# Patient Record
Sex: Female | Born: 1955 | Hispanic: No | Marital: Married | Smoking: Never smoker
Health system: Southern US, Community
[De-identification: ages and names within clinical notes are randomized; demographics above are authoritative.]

## PROBLEM LIST (undated history)

## (undated) DIAGNOSIS — Z8489 Family history of other specified conditions: Secondary | ICD-10-CM

## (undated) DIAGNOSIS — G4700 Insomnia, unspecified: Secondary | ICD-10-CM

---

## 2000-08-05 ENCOUNTER — Encounter: Payer: Self-pay | Admitting: Obstetrics and Gynecology

## 2000-08-05 ENCOUNTER — Encounter: Admission: RE | Admit: 2000-08-05 | Discharge: 2000-08-05 | Payer: Self-pay | Admitting: Obstetrics and Gynecology

## 2002-01-24 ENCOUNTER — Encounter: Payer: Self-pay | Admitting: Obstetrics and Gynecology

## 2002-01-24 ENCOUNTER — Encounter: Admission: RE | Admit: 2002-01-24 | Discharge: 2002-01-24 | Payer: Self-pay | Admitting: Obstetrics and Gynecology

## 2009-11-30 DIAGNOSIS — C50919 Malignant neoplasm of unspecified site of unspecified female breast: Secondary | ICD-10-CM

## 2009-11-30 HISTORY — PX: MASTECTOMY: SHX3

## 2009-11-30 HISTORY — DX: Malignant neoplasm of unspecified site of unspecified female breast: C50.919

## 2010-06-19 ENCOUNTER — Encounter: Admission: RE | Admit: 2010-06-19 | Discharge: 2010-06-19 | Payer: Self-pay | Admitting: Obstetrics and Gynecology

## 2010-06-23 ENCOUNTER — Encounter: Admission: RE | Admit: 2010-06-23 | Discharge: 2010-06-23 | Payer: Self-pay | Admitting: Obstetrics and Gynecology

## 2010-12-21 ENCOUNTER — Encounter: Payer: Self-pay | Admitting: Obstetrics and Gynecology

## 2016-11-06 HISTORY — PX: DE QUERVAIN'S RELEASE: SHX1439

## 2019-08-29 ENCOUNTER — Other Ambulatory Visit (HOSPITAL_COMMUNITY): Payer: Self-pay | Admitting: Podiatry

## 2019-08-29 ENCOUNTER — Other Ambulatory Visit: Payer: Self-pay | Admitting: Podiatry

## 2019-08-29 DIAGNOSIS — M79672 Pain in left foot: Secondary | ICD-10-CM

## 2019-08-29 DIAGNOSIS — M71372 Other bursal cyst, left ankle and foot: Secondary | ICD-10-CM

## 2019-09-07 ENCOUNTER — Ambulatory Visit: Payer: Medicare HMO

## 2019-09-07 ENCOUNTER — Ambulatory Visit: Admission: RE | Admit: 2019-09-07 | Payer: Medicare HMO | Source: Ambulatory Visit

## 2019-09-18 ENCOUNTER — Other Ambulatory Visit: Payer: Self-pay

## 2019-09-18 ENCOUNTER — Ambulatory Visit
Admission: RE | Admit: 2019-09-18 | Discharge: 2019-09-18 | Disposition: A | Discharge status: Home / Self Care | Payer: Medicare HMO | Source: Ambulatory Visit | Attending: Podiatry | Admitting: Podiatry

## 2019-09-18 DIAGNOSIS — M79672 Pain in left foot: Secondary | ICD-10-CM | POA: Diagnosis present

## 2019-09-18 DIAGNOSIS — M71372 Other bursal cyst, left ankle and foot: Secondary | ICD-10-CM | POA: Diagnosis not present

## 2019-09-18 MED ORDER — GADOBUTROL 1 MMOL/ML IV SOLN
6.0000 mL | Freq: Once | INTRAVENOUS | Status: AC | PRN
  Administered 2019-09-18: 19:00:00 6 mL via INTRAVENOUS

## 2019-09-27 ENCOUNTER — Other Ambulatory Visit: Payer: Self-pay | Admitting: Podiatry

## 2019-09-27 NOTE — Addendum Note (Signed)
Addended by: Caroline More on: 09/27/2019 01:16 PM   Modules accepted: Orders

## 2019-10-03 ENCOUNTER — Encounter: Payer: Self-pay | Admitting: *Deleted

## 2019-10-03 ENCOUNTER — Other Ambulatory Visit: Payer: Self-pay

## 2019-10-06 ENCOUNTER — Other Ambulatory Visit: Payer: Self-pay

## 2019-10-06 ENCOUNTER — Other Ambulatory Visit
Admission: RE | Admit: 2019-10-06 | Discharge: 2019-10-06 | Disposition: A | Discharge status: Home / Self Care | Payer: Medicare HMO | Source: Ambulatory Visit | Attending: Podiatry | Admitting: Podiatry

## 2019-10-06 DIAGNOSIS — Z01812 Encounter for preprocedural laboratory examination: Secondary | ICD-10-CM | POA: Diagnosis present

## 2019-10-06 DIAGNOSIS — Z20828 Contact with and (suspected) exposure to other viral communicable diseases: Secondary | ICD-10-CM | POA: Insufficient documentation

## 2019-10-06 LAB — SARS CORONAVIRUS 2 (TAT 6-24 HRS): SARS Coronavirus 2: NEGATIVE

## 2019-10-10 ENCOUNTER — Ambulatory Visit: Payer: Medicare HMO | Admitting: Anesthesiology

## 2019-10-10 ENCOUNTER — Other Ambulatory Visit: Payer: Self-pay

## 2019-10-10 ENCOUNTER — Ambulatory Visit
Admission: RE | Admit: 2019-10-10 | Discharge: 2019-10-10 | Disposition: A | Discharge status: Home / Self Care | Payer: Medicare HMO | Attending: Podiatry | Admitting: Podiatry

## 2019-10-10 ENCOUNTER — Encounter
Admission: RE | Disposition: A | Discharge status: Home / Self Care | Payer: Self-pay | Source: Home / Self Care | Attending: Podiatry

## 2019-10-10 DIAGNOSIS — F419 Anxiety disorder, unspecified: Secondary | ICD-10-CM | POA: Insufficient documentation

## 2019-10-10 DIAGNOSIS — M722 Plantar fascial fibromatosis: Secondary | ICD-10-CM | POA: Diagnosis present

## 2019-10-10 DIAGNOSIS — G4700 Insomnia, unspecified: Secondary | ICD-10-CM | POA: Diagnosis not present

## 2019-10-10 DIAGNOSIS — Z853 Personal history of malignant neoplasm of breast: Secondary | ICD-10-CM | POA: Diagnosis not present

## 2019-10-10 DIAGNOSIS — Z79899 Other long term (current) drug therapy: Secondary | ICD-10-CM | POA: Diagnosis not present

## 2019-10-10 HISTORY — PX: FASCIECTOMY: SHX6525

## 2019-10-10 HISTORY — DX: Family history of other specified conditions: Z84.89

## 2019-10-10 HISTORY — DX: Insomnia, unspecified: G47.00

## 2019-10-10 HISTORY — PX: FOREIGN BODY REMOVAL: SHX962

## 2019-10-10 SURGERY — FASCIECTOMY, PALM
Anesthesia: Regional | Site: Foot | Laterality: Left

## 2019-10-10 MED ORDER — GLYCOPYRROLATE 0.2 MG/ML IJ SOLN
INTRAMUSCULAR | Status: DC | PRN
  Administered 2019-10-10: 0.1 mg via INTRAVENOUS

## 2019-10-10 MED ORDER — PROPOFOL 10 MG/ML IV BOLUS
INTRAVENOUS | Status: DC | PRN
  Administered 2019-10-10: 30 mg via INTRAVENOUS
  Administered 2019-10-10: 150 mg via INTRAVENOUS

## 2019-10-10 MED ORDER — CEPHALEXIN 500 MG PO CAPS: 500.0000 mg | ORAL_CAPSULE | Freq: Three times a day (TID) | ORAL | 0 refills | Status: AC

## 2019-10-10 MED ORDER — CEFAZOLIN SODIUM-DEXTROSE 2-4 GM/100ML-% IV SOLN
2.0000 g | INTRAVENOUS | Status: AC
  Administered 2019-10-10: 2 g via INTRAVENOUS

## 2019-10-10 MED ORDER — POVIDONE-IODINE 7.5 % EX SOLN
Freq: Once | CUTANEOUS | Status: AC
  Administered 2019-10-10: 12:00:00 via TOPICAL

## 2019-10-10 MED ORDER — BUPIVACAINE HCL 0.25 % IJ SOLN
INTRAMUSCULAR | Status: DC | PRN
  Administered 2019-10-10: 20 mL

## 2019-10-10 MED ORDER — OXYCODONE-ACETAMINOPHEN 7.5-325 MG PO TABS: 1.0000 | ORAL_TABLET | Freq: Four times a day (QID) | ORAL | 0 refills | Status: AC | PRN

## 2019-10-10 MED ORDER — OXYCODONE HCL 5 MG/5ML PO SOLN: 5.0000 mg | Freq: Once | ORAL | Status: DC | PRN

## 2019-10-10 MED ORDER — ONDANSETRON HCL 4 MG/2ML IJ SOLN
INTRAMUSCULAR | Status: DC | PRN
  Administered 2019-10-10: 4 mg via INTRAVENOUS

## 2019-10-10 MED ORDER — OXYCODONE HCL 5 MG PO TABS: 5.0000 mg | ORAL_TABLET | Freq: Once | ORAL | Status: DC | PRN

## 2019-10-10 MED ORDER — LIDOCAINE HCL (CARDIAC) PF 100 MG/5ML IV SOSY
PREFILLED_SYRINGE | INTRAVENOUS | Status: DC | PRN
  Administered 2019-10-10: 40 mg via INTRATRACHEAL

## 2019-10-10 MED ORDER — ONDANSETRON 4 MG PO TBDP
4.0000 mg | ORAL_TABLET | Freq: Once | ORAL | Status: AC
  Administered 2019-10-10: 4 mg via ORAL

## 2019-10-10 MED ORDER — EPHEDRINE SULFATE 50 MG/ML IJ SOLN
INTRAMUSCULAR | Status: DC | PRN
  Administered 2019-10-10: 10 mg via INTRAVENOUS

## 2019-10-10 MED ORDER — LACTATED RINGERS IV SOLN
INTRAVENOUS | Status: DC
  Administered 2019-10-10: 11:00:00 via INTRAVENOUS

## 2019-10-10 MED ORDER — MIDAZOLAM HCL 2 MG/2ML IJ SOLN
INTRAMUSCULAR | Status: DC | PRN
  Administered 2019-10-10: 2 mg via INTRAVENOUS

## 2019-10-10 MED ORDER — DEXAMETHASONE SODIUM PHOSPHATE 4 MG/ML IJ SOLN
INTRAMUSCULAR | Status: DC | PRN
  Administered 2019-10-10: 4 mg via PERINEURAL

## 2019-10-10 MED ORDER — ONDANSETRON HCL 4 MG PO TABS: 4.0000 mg | ORAL_TABLET | Freq: Three times a day (TID) | ORAL | 0 refills | Status: AC | PRN

## 2019-10-10 MED ORDER — FENTANYL CITRATE (PF) 100 MCG/2ML IJ SOLN
INTRAMUSCULAR | Status: DC | PRN
  Administered 2019-10-10: 25 ug via INTRAVENOUS
  Administered 2019-10-10: 100 ug via INTRAVENOUS

## 2019-10-10 MED ORDER — ROPIVACAINE HCL 5 MG/ML IJ SOLN
INTRAMUSCULAR | Status: DC | PRN
  Administered 2019-10-10 (×2): 20 mL via PERINEURAL

## 2019-10-10 SURGICAL SUPPLY — 33 items
BNDG CMPR STD VLCR NS LF 5.8X4 (GAUZE/BANDAGES/DRESSINGS) ×1
BNDG ELASTIC 4X5.8 VLCR NS LF (GAUZE/BANDAGES/DRESSINGS) ×2 IMPLANT
BNDG ESMARK 4X12 TAN STRL LF (GAUZE/BANDAGES/DRESSINGS) ×2 IMPLANT
BNDG GAUZE 4.5X4.1 6PLY STRL (MISCELLANEOUS) ×2 IMPLANT
CANISTER SUCT 1200ML W/VALVE (MISCELLANEOUS) ×2 IMPLANT
COVER LIGHT HANDLE FLEXIBLE (MISCELLANEOUS) ×4 IMPLANT
CUFF TOURN SGL QUICK 18X4 (TOURNIQUET CUFF) ×2 IMPLANT
DRAPE FLUOR MINI C-ARM 54X84 (DRAPES) ×2 IMPLANT
DURAPREP 26ML APPLICATOR (WOUND CARE) ×2 IMPLANT
ELECT REM PT RETURN 9FT ADLT (ELECTROSURGICAL) ×2
ELECTRODE REM PT RTRN 9FT ADLT (ELECTROSURGICAL) ×1 IMPLANT
GAUZE SPONGE 4X4 12PLY STRL (GAUZE/BANDAGES/DRESSINGS) ×2 IMPLANT
GAUZE XEROFORM 5X9 LF (GAUZE/BANDAGES/DRESSINGS) ×1 IMPLANT
GLOVE BIO SURGEON STRL SZ7 (GLOVE) ×2 IMPLANT
GLOVE BIOGEL PI IND STRL 7.0 (GLOVE) ×1 IMPLANT
GLOVE BIOGEL PI INDICATOR 7.0 (GLOVE) ×1
GOWN STRL REUS W/ TWL LRG LVL3 (GOWN DISPOSABLE) ×2 IMPLANT
GOWN STRL REUS W/TWL LRG LVL3 (GOWN DISPOSABLE) ×4
K-WIRE DBL END TROCAR 6X.062 (WIRE) ×2
KIT CARPAL TUNNEL (MISCELLANEOUS) ×2
KIT PRC PRB RTRGD 3ANG KNF HND (MISCELLANEOUS) IMPLANT
KIT TURNOVER KIT A (KITS) ×2 IMPLANT
KWIRE DBL END TROCAR 6X.062 (WIRE) IMPLANT
NS IRRIG 500ML POUR BTL (IV SOLUTION) ×2 IMPLANT
PACK EXTREMITY ARMC (MISCELLANEOUS) ×2 IMPLANT
PENCIL SMOKE EVACUATOR (MISCELLANEOUS) ×2 IMPLANT
STOCKINETTE IMPERVIOUS LG (DRAPES) ×2 IMPLANT
SUT ETHILON 4-0 (SUTURE) ×4
SUT ETHILON 4-0 FS2 18XMFL BLK (SUTURE) ×2
SUT VIC AB 3-0 SH 27 (SUTURE) ×2
SUT VIC AB 3-0 SH 27X BRD (SUTURE) IMPLANT
SUTURE ETHLN 4-0 FS2 18XMF BLK (SUTURE) IMPLANT
WAND TOPAZ MICRO DEBRIDER (MISCELLANEOUS) ×1 IMPLANT

## 2019-10-10 NOTE — Anesthesia Procedure Notes (Addendum)
Anesthesia Regional Block: Adductor canal block   Pre-Anesthetic Checklist: ,, timeout performed, Correct Patient, Correct Site, Correct Laterality, Correct Procedure, Correct Position, site marked, Risks and benefits discussed,  Surgical consent,  Pre-op evaluation,  At surgeon's request and post-op pain management  Laterality: Left  Prep: chloraprep       Needles:  Injection technique: Single-shot  Needle Type: Echogenic Needle     Needle Length: 9cm  Needle Gauge: 21     Additional Needles:   Procedures:,,,, ultrasound used (permanent image in chart),,,,   Nerve Stimulator or Paresthesia:   Additional Responses:  Supine position Narrative:  Start time: 10/10/2019 9:23 AM End time: 10/10/2019 9:28 AM Injection made incrementally with aspirations every 5 mL.  Performed by: Personally  Anesthesiologist: Fidel Levy, MD  Additional Notes: Functioning IV was confirmed and monitors applied. Ultrasound guidance: relevant anatomy identified, needle position confirmed, local anesthetic spread visualized around nerve(s)., vascular puncture avoided.  Image printed for medical record.  Negative aspiration and no paresthesias; incremental administration of local anesthetic. The patient tolerated the procedure well. Vitals signes recorded in RN notes.

## 2019-10-10 NOTE — Anesthesia Preprocedure Evaluation (Signed)
Anesthesia Evaluation  Patient identified by MRN, date of birth, ID band Patient awake    Reviewed: NPO status   History of Anesthesia Complications Negative for: history of anesthetic complications  Airway Mallampati: II  TM Distance: >3 FB Neck ROM: full    Dental no notable dental hx.    Pulmonary neg pulmonary ROS,    Pulmonary exam normal        Cardiovascular Exercise Tolerance: Good negative cardio ROS Normal cardiovascular exam     Neuro/Psych Anxiety RLegS negative neurological ROS  negative psych ROS   GI/Hepatic negative GI ROS, Neg liver ROS,   Endo/Other  negative endocrine ROS  Renal/GU negative Renal ROS  negative genitourinary   Musculoskeletal   Abdominal   Peds  Hematology R breast cancer 2012 > R mastectomy. NO IV, NO BP on R arm.   Anesthesia Other Findings Covid: NEG.  Reproductive/Obstetrics                             Anesthesia Physical Anesthesia Plan  ASA: II  Anesthesia Plan: Regional and General   Post-op Pain Management: GA combined w/ Regional for post-op pain   Induction:   PONV Risk Score and Plan: 2 and Midazolam and Ondansetron  Airway Management Planned:   Additional Equipment:   Intra-op Plan:   Post-operative Plan:   Informed Consent: I have reviewed the patients History and Physical, chart, labs and discussed the procedure including the risks, benefits and alternatives for the proposed anesthesia with the patient or authorized representative who has indicated his/her understanding and acceptance.       Plan Discussed with: CRNA  Anesthesia Plan Comments:         Anesthesia Quick Evaluation

## 2019-10-10 NOTE — Anesthesia Procedure Notes (Signed)
Procedure Name: LMA Insertion Date/Time: 10/10/2019 12:29 PM Performed by: Mayme Genta, CRNA Pre-anesthesia Checklist: Patient identified, Emergency Drugs available, Suction available, Timeout performed and Patient being monitored Patient Re-evaluated:Patient Re-evaluated prior to induction Oxygen Delivery Method: Circle system utilized Preoxygenation: Pre-oxygenation with 100% oxygen Induction Type: IV induction LMA: LMA inserted LMA Size: 4.0 Number of attempts: 1 Placement Confirmation: positive ETCO2 and breath sounds checked- equal and bilateral Tube secured with: Tape

## 2019-10-10 NOTE — Progress Notes (Signed)
Assisted Debabtrata Maji ANMD with left, ultrasound guided, popliteal/saphenous block. Side rails up, monitors on throughout procedure. See vital signs in flow sheet. Tolerated Procedure well.

## 2019-10-10 NOTE — Transfer of Care (Signed)
Immediate Anesthesia Transfer of Care Note  Patient: Angela Weber  Procedure(s) Performed: PLANTAR FASCIECTOMY- INCIS.ONLY LEFT (Left Foot) REMOVAL FOREIGN BODY DEEP (Left Foot)  Patient Location: PACU  Anesthesia Type: Regional, General  Level of Consciousness: awake, alert  and patient cooperative  Airway and Oxygen Therapy: Patient Spontanous Breathing and Patient connected to supplemental oxygen  Post-op Assessment: Post-op Vital signs reviewed, Patient's Cardiovascular Status Stable, Respiratory Function Stable, Patent Airway and No signs of Nausea or vomiting  Post-op Vital Signs: Reviewed and stable  Complications: No apparent anesthesia complications

## 2019-10-10 NOTE — Discharge Instructions (Signed)
[place] DR. TROXLER, DR. Vickki Muff, AND DR. [place]   1. Take your medication as prescribed.  Pain medication should be taken only as needed.  Begin by taking her Percocet medication once every 6 hours.  If you continued to have pain you may take the Percocet medication once every 4 hours.  If you continue to have pain at that point then try taking Tylenol and/or ibuprofen between doses.  You can have a maximum dosage of acetaminophen or Tylenol of 4000 mg a day.  The Percocet has 325 mg of Tylenol in each pill.  If you still even further continue to have pain then you can take 2 pills every 6 hours of Percocet.  Please take antibiotics and antinausea medicine as prescribed.  2. Keep the dressing clean, dry and intact.  3. Keep your foot elevated above the heart level for the first 48 hours.  4. Walking to the bathroom and brief periods of walking are acceptable, unless we have instructed you to be non-weight bearing.  You are required to be nonweightbearing for the next 4 weeks on your left lower extremity.  Please do not apply any pressure to the heel.  5. Always wear your post-op shoe when walking.  Always use your crutches if you are to be non-weight bearing.  6. Do not take a shower. Baths are permissible as long as the foot is kept out of the water.   7. Every hour you are awake:  - Bend your knee 15 times. - Flex foot 15 times - Massage calf 15 times  8. Call [facility] [phone]) if any of the following problems occur: - You develop a temperature or fever. - The bandage becomes saturated with blood. - Medication does not stop your pain. - Injury of the foot occurs. - Any symptoms of infection including redness, odor, or red streaks running from wound.   General Anesthesia, Adult, Care After This sheet gives you information about how to care for yourself  after your procedure. Your health care provider may also give you more specific instructions. If you have problems or questions, contact your health care provider. What can I expect after the procedure? After the procedure, the following side effects are common:  Pain or discomfort at the IV site.  Nausea.  Vomiting.  Sore throat.  Trouble concentrating.  Feeling cold or chills.  Weak or tired.  Sleepiness and fatigue.  Soreness and body aches. These side effects can affect parts of the body that were not involved in surgery. Follow these instructions at home:  For at least 24 hours after the procedure:  Have a responsible adult stay with you. It is important to have someone help care for you until you are awake and alert.  Rest as needed.  Do not: ? Participate in activities in which you could fall or become injured. ? Drive. ? Use heavy machinery. ? Drink alcohol. ? Take sleeping pills or medicines that cause drowsiness. ? Make important decisions or sign legal documents. ? Take care of children on your own. Eating and drinking  Follow any instructions from your health care provider about eating or drinking restrictions.  When you feel hungry, start by eating small amounts of foods that are soft and easy to digest (bland), such as toast. Gradually return to your regular diet.  Drink enough fluid to keep your urine pale yellow.  If you vomit, rehydrate  by drinking water, juice, or clear broth. General instructions  If you have sleep apnea, surgery and certain medicines can increase your risk for breathing problems. Follow instructions from your health care provider about wearing your sleep device: ? Anytime you are sleeping, including during daytime naps. ? While taking prescription pain medicines, sleeping medicines, or medicines that make you drowsy.  Return to your normal activities as told by your health care provider. Ask your health care provider what  activities are safe for you.  Take over-the-counter and prescription medicines only as told by your health care provider.  If you smoke, do not smoke without supervision.  Keep all follow-up visits as told by your health care provider. This is important. Contact a health care provider if:  You have nausea or vomiting that does not get better with medicine.  You cannot eat or drink without vomiting.  You have pain that does not get better with medicine.  You are unable to pass urine.  You develop a skin rash.  You have a fever.  You have redness around your IV site that gets worse. Get help right away if:  You have difficulty breathing.  You have chest pain.  You have blood in your urine or stool, or you vomit blood. Summary  After the procedure, it is common to have a sore throat or nausea. It is also common to feel tired.  Have a responsible adult stay with you for the first 24 hours after general anesthesia. It is important to have someone help care for you until you are awake and alert.  When you feel hungry, start by eating small amounts of foods that are soft and easy to digest (bland), such as toast. Gradually return to your regular diet.  Drink enough fluid to keep your urine pale yellow.  Return to your normal activities as told by your health care provider. Ask your health care provider what activities are safe for you. This information is not intended to replace advice given to you by your health care provider. Make sure you discuss any questions you have with your health care provider. Document Released: 02/22/2001 Document Revised: 11/19/2017 Document Reviewed: 07/02/2017 Elsevier Patient Education  2020 [employer].

## 2019-10-10 NOTE — Anesthesia Procedure Notes (Addendum)
Anesthesia Regional Block: Popliteal block   Pre-Anesthetic Checklist: ,, timeout performed, Correct Patient, Correct Site, Correct Laterality, Correct Procedure, Correct Position, site marked, Risks and benefits discussed,  Surgical consent,  Pre-op evaluation,  At surgeon's request and post-op pain management  Laterality: Left  Prep: chloraprep       Needles:  Injection technique: Single-shot  Needle Type: Echogenic Needle     Needle Length: 9cm  Needle Gauge: 21     Additional Needles:   Procedures:,,,, ultrasound used (permanent image in chart),,,,   Nerve Stimulator or Paresthesia:   Additional Responses:  R lateral psotion Narrative:  Start time: 10/10/2019 9:18 AM End time: 10/10/2019 9:23 AM Injection made incrementally with aspirations every 5 mL.  Performed by: Personally  Anesthesiologist: Fidel Levy, MD  Additional Notes: Functioning IV was confirmed and monitors applied. Ultrasound guidance: relevant anatomy identified, needle position confirmed, local anesthetic spread visualized around nerve(s)., vascular puncture avoided.  Image printed for medical record.  Negative aspiration and no paresthesias; incremental administration of local anesthetic. The patient tolerated the procedure well. Vitals signes recorded in RN notes.

## 2019-10-10 NOTE — Anesthesia Postprocedure Evaluation (Signed)
Anesthesia Post Note  Patient: Angela Weber  Procedure(s) Performed: PLANTAR FASCIECTOMY- INCIS.ONLY LEFT (Left Foot) REMOVAL FOREIGN BODY DEEP (Left Foot)     Patient location during evaluation: PACU Anesthesia Type: Regional Level of consciousness: awake and alert Pain management: pain level controlled Vital Signs Assessment: post-procedure vital signs reviewed and stable Respiratory status: spontaneous breathing, nonlabored ventilation, respiratory function stable and patient connected to nasal cannula oxygen Cardiovascular status: blood pressure returned to baseline and stable Postop Assessment: no apparent nausea or vomiting Anesthetic complications: no    Kally Cadden

## 2019-10-10 NOTE — Op Note (Signed)
PODIATRY / FOOT AND ANKLE SURGERY OPERATIVE REPORT    SURGEON: Caroline More, DPM  PRE-OPERATIVE DIAGNOSIS:  1. Plantar fasciitis left foot, partial tear, chronic thickening 2. Possible foreign body/bursa like formation plantar left heel  POST-OPERATIVE DIAGNOSIS: same  PROCEDURE(S): 1. Left open plantar fasciiotomy with partial plantar fasciectomy and removal of scar tissue 2. Topaz plantar left heel  HEMOSTASIS: ankle left tourniquet  ANESTHESIA: general, preop popliteal and saphenous nerve block  ESTIMATED BLOOD LOSS: 10 cc  FINDING(S): 1.  Very thick medial and central bands of the plantar fascia 2.  Partial tear of the plantar fascia medial band  3.  Large amount of scar tissue to the plantar left heel in the area of the plantar fascia, no foreign body present  PATHOLOGY/SPECIMEN(S):  Thickened plantar fascia scar tissue  INDICATIONS:   Angela Weber is a 63 y.o. female who presents with plantar heel pain for months.  Patient was seen in clinic after saying that she stepped on a Christmas type ornament.  Patient delayed coming into clinic due to fears of COVID-19.  On presentation patient has pain on palpation of the medial tubercle the calcaneal tuberosity and has no signs of a puncture wound present.  Ultrasound imaging was used to examine the plantar aspect of the left heel which showed an irregularity in the soft tissues as well as a thickened band of the plantar fascia.  An MRI was subsequently performed at that point showing no specific foreign body present but strange bursal-like area at the plantar aspect the left heel and a thickened left plantar fascia with partial tear.  Patient also has mild edema at the level of the calcaneus at the insertion of the plantar fascia.  Discussed all treatment options with the patient both conservative and surgical attempts at correction at this time the patient is elected for surgery consisting of resection of the plantar fascia with  possible removal of foreign body/scar tissue/bursa resection.  DESCRIPTION: After obtaining full informed written consent, the patient was brought back to the operating room and placed supine upon the operating table.  The patient received IV antibiotics prior to induction.  Left ankle tourniquet was applied.  After obtaining adequate anesthesia, the patient was prepped and draped in the standard fashion.  An Esmarch bandage was used to exsanguinate the left lower extremity and pneumatic ankle tourniquet was inflated.  A curvilinear incision was made over the medial aspect of the heel with the distal aspect of the incision extending to the plantar arch level.  The incision was deepened to the subcutaneous tissues utilizing sharp and blunt dissection care was taken to identify and retract all vital neurovascular structures all venous contributories were cauterized necessary.  Blunt dissection was continued through the fat pad of the heel creating a full-thickness flap that was elevated being careful not to remove any of the fat from the heel in general.  The plantar fascia was then able to be visualized distally and it was followed back to the proximal insertion of the heel.  The subcutaneous tissue was dissected and explored a little further through the plantar aspect of the heel especially at the medial tubercle of the calcaneus and no bursa or foreign body was noted within the soft tissues of the subcutaneous area.  The medial band of the plantar fascia as well as one half of the central band of the plantar fascia was resected slightly distal to the insertion of the calcaneus and a triangular wedge was resected and  passed off in the operative site and sent off for pathology.  The plantar fascia appeared to be severely thickened overall was approximately 5 mm thick.  The flexor digitorum brevis muscle belly was able to be identified after transection of the plantar fascia was performed indicating successful  release.  The fascia appeared to be severely thickened at the insertion of the heel so portion of the plantar fascial from this area was also resected and passed off the operative site and sent off for pathology.  No foreign body was able to be visualized within the soft tissues of the plantar fascia or the subcutaneous tissue at the plantar heel.  The surgical site was flushed with copious amounts normal sterile saline.  The subcutaneous tissue was reapproximated well coapted with 3-0 Vicryl and the skin was then reapproximated well coapted with 4-0 nylon in a combination of simple and horizontal mattress type stitching.  Attention was then directed to the plantar aspect the left heel where a 0.062 K wire was used to create a grid at the plantar aspect of the heel that was 5 x 3.  The K wire was used to puncture the skin into the subcutaneous tissue.  The Topaz instrument was then used in each hole at the plantar aspect of the heel along the course the plantar fascia and was used 3 times in each hole x15 holes total.  20 cc of half percent Marcaine plain was injected about the operative site.  After the procedure Xeroform was applied to the surgical sites followed by 4 x 4 gauze, ABD, Kerlix, Ace wrap.  The pneumatic ankle tourniquet was deflated and a prompt hyperemic response was noted all digits left foot.  The patient tolerated the procedure and anesthesia well was transferred to recovery room fossae and stable vascular status intact to all toes left foot.  Following.  Postoperative monitoring the patient be discharged home the following written and oral postop instructions: Keep surgical dressings clean, dry, and intact, remain nonweightbearing to left lower extremity all times, ice and elevate left lower extremity when at rest, take postop pain medication, antibiotics, and antinausea medicine as prescribed.  Patient should follow-up in clinic within 1 week of discharge date.  COMPLICATIONS:  none  CONDITION: good, stable  Caroline More, DPM

## 2019-10-10 NOTE — H&P (Signed)
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  The various methods of treatment have been discussed with the patient.  No guarantees were given.  After consideration of risks, benefits and other options for treatment, the patient has consented to surgery.  I have reviewed the patients' chart and labs.    Procedure: left plantar fasciotomy with topaz debridement, removal of foreign body/bursal type tissue  A history and physical examination was performed in my office.  The patient was reexamined.  There have been no changes to this history and physical examination.  Caroline More, DPM

## 2019-10-11 ENCOUNTER — Encounter: Payer: Self-pay | Admitting: Podiatry

## 2019-10-12 LAB — SURGICAL PATHOLOGY

## 2020-01-05 ENCOUNTER — Other Ambulatory Visit: Payer: Self-pay

## 2020-01-05 ENCOUNTER — Ambulatory Visit
Admission: EM | Admit: 2020-01-05 | Discharge: 2020-01-05 | Disposition: A | Discharge status: Home / Self Care | Payer: Medicare HMO | Attending: Emergency Medicine | Admitting: Emergency Medicine

## 2020-01-05 DIAGNOSIS — Z20822 Contact with and (suspected) exposure to covid-19: Secondary | ICD-10-CM

## 2020-01-05 DIAGNOSIS — J0100 Acute maxillary sinusitis, unspecified: Secondary | ICD-10-CM

## 2020-01-05 MED ORDER — AMOXICILLIN-POT CLAVULANATE 875-125 MG PO TABS: 1.0000 | ORAL_TABLET | Freq: Two times a day (BID) | ORAL | 0 refills | Status: AC

## 2020-01-05 MED ORDER — BENZONATATE 100 MG PO CAPS: 100.0000 mg | ORAL_CAPSULE | Freq: Three times a day (TID) | ORAL | 0 refills | Status: DC

## 2020-01-05 MED ORDER — FLUTICASONE PROPIONATE 50 MCG/ACT NA SUSP: 2.0000 | Freq: Every day | NASAL | 0 refills | Status: AC

## 2020-01-05 MED ORDER — CETIRIZINE HCL 10 MG PO TABS: 10.0000 mg | ORAL_TABLET | Freq: Every day | ORAL | 0 refills | Status: AC

## 2020-01-05 NOTE — ED Triage Notes (Signed)
Pt presents to UC w/ c/o headaches, body aches, sore throat x4 days. Pt states this is worst headache she's ever had.

## 2020-01-05 NOTE — Discharge Instructions (Addendum)
Discussed worst headache of life with patient.  Declines further work-up in the ED at this time.  Aware of risk associated with this decision including missed diagnosis, organ damage, organ failure, and/or death.  Would like to try outpatient therapy first.    Based on symptoms will treat for sinusitis COVID testing ordered.  It will take between 2-5 days for test results.  Someone will contact you regarding abnormal results.    In the meantime: You should remain isolated in your home for 10 days from symptom onset AND greater than 72 hours after symptoms resolution (absence of fever without the use of fever-reducing medication and improvement in respiratory symptoms), whichever is longer Get plenty of rest and push fluids Tessalon Perles prescribed for cough Use OTC zyrtec for nasal congestion, runny nose, and/or sore throat Use OTC flonase for nasal congestion and runny nose Use medications daily for symptom relief Use OTC medications like ibuprofen or tylenol as needed fever or pain Call or go to the ED if you have any new or worsening symptoms such as fever, worsening cough, shortness of breath, chest tightness, chest pain, turning blue, changes in mental status, etc..Marland Kitchen

## 2020-01-05 NOTE — ED Provider Notes (Signed)
[place]   [id number] 01/05/20 Arrival Time: 1243   CC: COVID symptoms  SUBJECTIVE: History from: patient.  Angela Weber is a 64 y.o. female who presents with headache, runny nose, congestion, sore throat, mild productive cough, body aches, chills, and fever, tmax of 100.9 today, x 5 days.  Cousins daughter tested positive for COVID.  Cousin is awaiting covid results.  Has tried OTC medication with relief.  Symptoms were improving, but began worsening 2 days ago.  Reports previous symptoms in the past with headache associated with headache.   Denies SOB, wheezing, chest pain, nausea, vomiting, changes in bowel or bladder habits.    ROS: As per HPI.  All other pertinent ROS negative.     Past Medical History:  Diagnosis Date  . Breast cancer ([place]) 2011  . Family history of adverse reaction to anesthesia    Mother, Sister - PONV  . Insomnia    Past Surgical History:  Procedure Laterality Date  . DE QUERVAIN'S RELEASE Left 11/06/2016  . FASCIECTOMY Left 10/10/2019   Procedure: PLANTAR FASCIECTOMY- INCIS.ONLY LEFT;  Surgeon: Caroline More, DPM;  Location: [place];  Service: Podiatry;  Laterality: Left;  preop sash pop block  . FOREIGN BODY REMOVAL Left 10/10/2019   Procedure: REMOVAL FOREIGN BODY DEEP;  Surgeon: Caroline More, DPM;  Location: [place];  Service: Podiatry;  Laterality: Left;  Marland Kitchen MASTECTOMY Right 2011   No Known Allergies No current facility-administered medications on file prior to encounter.   Current Outpatient Medications on File Prior to Encounter  Medication Sig Dispense Refill  . Ascorbic Acid (VITAMIN C PO) Take by mouth daily.    . Cholecalciferol (VITAMIN D3) 10 MCG (400 UNIT) CAPS Take 800 mg by mouth daily.    . clobetasol cream (TEMOVATE) [id number] % Apply 1 application topically 2 (two) times daily as needed.    Marland Kitchen escitalopram (LEXAPRO) 20 MG tablet Take 20 mg by mouth daily.    Marland Kitchen ibuprofen (ADVIL) 200 MG tablet Take 200  mg by mouth every 6 (six) hours as needed.    Marland Kitchen LORazepam (ATIVAN) 1 MG tablet Take 1 mg by mouth at bedtime as needed for anxiety.    . Multiple Vitamin (MULTIVITAMIN) tablet Take 1 tablet by mouth daily.    Marland Kitchen rOPINIRole (REQUIP) 0.25 MG tablet Take 0.25 mg by mouth at bedtime as needed.    . traZODone (DESYREL) 50 MG tablet Take 50 mg by mouth at bedtime.     Social History   Socioeconomic History  . Marital status: Married    Spouse name: Not on file  . Number of children: Not on file  . Years of education: Not on file  . Highest education level: Not on file  Occupational History  . Not on file  Tobacco Use  . Smoking status: Never Smoker  . Smokeless tobacco: Never Used  Substance and Sexual Activity  . Alcohol use: Not Currently  . Drug use: Not on file  . Sexual activity: Not on file  Other Topics Concern  . Not on file  Social History Narrative  . Not on file   Social Determinants of Health   Financial Resource Strain:   . Difficulty of Paying Living Expenses: Not on file  Food Insecurity:   . Worried About Charity fundraiser in the Last Year: Not on file  . Ran Out of Food in the Last Year: Not on file  Transportation Needs:   . Lack of Transportation (  Medical): Not on file  . Lack of Transportation (Non-Medical): Not on file  Physical Activity:   . Days of Exercise per Week: Not on file  . Minutes of Exercise per Session: Not on file  Stress:   . Feeling of Stress : Not on file  Social Connections:   . Frequency of Communication with Friends and Family: Not on file  . Frequency of Social Gatherings with Friends and Family: Not on file  . Attends Religious Services: Not on file  . Active Member of Clubs or Organizations: Not on file  . Attends Archivist Meetings: Not on file  . Marital Status: Not on file  Intimate Partner Violence:   . Fear of Current or Ex-Partner: Not on file  . Emotionally Abused: Not on file  . Physically Abused: Not on  file  . Sexually Abused: Not on file   Family History  Problem Relation Age of Onset  . Healthy Mother   . Healthy Father     OBJECTIVE:  Vitals:   01/05/20 1251  BP: (!) 144/66  Pulse: 79  Resp: 16  Temp: 98.9 F (37.2 C)  TempSrc: Oral  SpO2: 95%     General appearance: alert; appears mildly fatigued, but nontoxic; speaking in full sentences and tolerating own secretions HEENT: NCAT; Ears: EACs clear, TMs pearly gray; Eyes: PERRL.  EOM grossly intact. Sinuses: TTP over maxillary sinuses; Nose: nares patent without rhinorrhea, Throat: oropharynx clear, tonsils non erythematous or enlarged, uvula midline  Neck: supple without LAD Lungs: unlabored respirations, symmetrical air entry; cough: mild; no respiratory distress; CTAB Heart: regular rate and rhythm.  Skin: warm and dry Psychological: alert and cooperative; mildly anxious mood and affect  ASSESSMENT & PLAN:  1. Suspected COVID-19 virus infection   2. Acute non-recurrent maxillary sinusitis     Meds ordered this encounter  Medications  . amoxicillin-clavulanate (AUGMENTIN) 875-125 MG tablet    Sig: Take 1 tablet by mouth every 12 (twelve) hours for 10 days.    Dispense:  20 tablet    Refill:  0    Order Specific Question:   Supervising Provider    Answer:   Raylene Everts [id number]  . cetirizine (ZYRTEC) 10 MG tablet    Sig: Take 1 tablet (10 mg total) by mouth daily.    Dispense:  30 tablet    Refill:  0    Order Specific Question:   Supervising Provider    Answer:   Raylene Everts [id number]  . fluticasone (FLONASE) 50 MCG/ACT nasal spray    Sig: Place 2 sprays into both nostrils daily.    Dispense:  16 g    Refill:  0    Order Specific Question:   Supervising Provider    Answer:   Raylene Everts [id number]  . benzonatate (TESSALON) 100 MG capsule    Sig: Take 1 capsule (100 mg total) by mouth every 8 (eight) hours.    Dispense:  21 capsule    Refill:  0    Order Specific Question:    Supervising Provider    Answer:   NIJHA, KORPELA [id number]   Discussed worst headache of life with patient.  Declines further work-up in the ED at this time.  Aware of risk associated with this decision including missed diagnosis, organ damage, organ failure, and/or death.  Would like to try outpatient therapy first.    Based on symptoms will treat for sinusitis COVID testing ordered.  It will take between  2-5 days for test results.  Someone will contact you regarding abnormal results.    In the meantime: You should remain isolated in your home for 10 days from symptom onset AND greater than 72 hours after symptoms resolution (absence of fever without the use of fever-reducing medication and improvement in respiratory symptoms), whichever is longer Get plenty of rest and push fluids Tessalon Perles prescribed for cough Use OTC zyrtec for nasal congestion, runny nose, and/or sore throat Use OTC flonase for nasal congestion and runny nose Use medications daily for symptom relief Use OTC medications like ibuprofen or tylenol as needed fever or pain Call or go to the ED if you have any new or worsening symptoms such as fever, worsening cough, shortness of breath, chest tightness, chest pain, turning blue, changes in mental status, etc...   Reviewed expectations re: course of current medical issues. Questions answered. Outlined signs and symptoms indicating need for more acute intervention. Patient verbalized understanding. After Visit Summary given.         Lestine Box, PA-C 01/05/20 1320

## 2020-01-07 ENCOUNTER — Other Ambulatory Visit: Payer: Self-pay | Admitting: Physician Assistant

## 2020-01-07 DIAGNOSIS — R54 Age-related physical debility: Secondary | ICD-10-CM

## 2020-01-07 DIAGNOSIS — U071 COVID-19: Secondary | ICD-10-CM

## 2020-01-07 LAB — NOVEL CORONAVIRUS, NAA: SARS-CoV-2, NAA: DETECTED — AB

## 2020-01-07 NOTE — Progress Notes (Signed)
  I connected by phone with Angela Weber on 01/07/2020 at 5:27 PM to discuss the potential use of an new treatment for mild to moderate COVID-19 viral infection in non-hospitalized patients.  This patient is a 64 y.o. female that meets the FDA criteria for Emergency Use Authorization of bamlanivimab or casirivimab\imdevimab.  Has a (+) direct SARS-CoV-2 viral test result  Has mild or moderate COVID-19   Is ? 66 years of age and weighs ? 40 kg  Is NOT hospitalized due to COVID-19  Is NOT requiring oxygen therapy or requiring an increase in baseline oxygen flow rate due to COVID-19  Is within 10 days of symptom onset  Has at least one of the high risk factor(s) for progression to severe COVID-19 and/or hospitalization as defined in EUA.  Specific high risk criteria : >/= 66 yo   I have spoken and communicated the following to the patient or parent/caregiver:  1. FDA has authorized the emergency use of bamlanivimab and casirivimab\imdevimab for the treatment of mild to moderate COVID-19 in adults and pediatric patients with positive results of direct SARS-CoV-2 viral testing who are 40 years of age and older weighing at least 40 kg, and who are at high risk for progressing to severe COVID-19 and/or hospitalization.  2. The significant known and potential risks and benefits of bamlanivimab and casirivimab\imdevimab, and the extent to which such potential risks and benefits are unknown.  3. Information on available alternative treatments and the risks and benefits of those alternatives, including clinical trials.  4. Patients treated with bamlanivimab and casirivimab\imdevimab should continue to self-isolate and use infection control measures (e.g., wear mask, isolate, social distance, avoid sharing personal items, clean and disinfect "high touch" surfaces, and frequent handwashing) according to CDC guidelines.   5. The patient or parent/caregiver has the option to accept or refuse  bamlanivimab or casirivimab\imdevimab .  After reviewing this information with the patient, The patient agreed to proceed with receiving the casirivimab\imdevimab infusion and will be provided a copy of the Fact sheet prior to receiving the infusion.   Pt set up for 01/08/20 @ 12:30pm. Sx onset 2/2. Directions given.   [name] 01/07/2020 5:27 PM

## 2020-01-08 ENCOUNTER — Ambulatory Visit (HOSPITAL_COMMUNITY)
Admission: RE | Admit: 2020-01-08 | Discharge: 2020-01-08 | Disposition: A | Discharge status: Home / Self Care | Payer: Medicare Other | Source: Ambulatory Visit | Attending: Pulmonary Disease | Admitting: Pulmonary Disease

## 2020-01-08 DIAGNOSIS — U071 COVID-19: Secondary | ICD-10-CM | POA: Diagnosis present

## 2020-01-08 DIAGNOSIS — R54 Age-related physical debility: Secondary | ICD-10-CM | POA: Diagnosis present

## 2020-01-08 DIAGNOSIS — Z23 Encounter for immunization: Secondary | ICD-10-CM | POA: Diagnosis not present

## 2020-01-08 MED ORDER — EPINEPHRINE 0.3 MG/0.3ML IJ SOAJ: 0.3000 mg | Freq: Once | INTRAMUSCULAR | Status: DC | PRN

## 2020-01-08 MED ORDER — FAMOTIDINE IN NACL 20-0.9 MG/50ML-% IV SOLN: 20.0000 mg | Freq: Once | INTRAVENOUS | Status: DC | PRN

## 2020-01-08 MED ORDER — DIPHENHYDRAMINE HCL 50 MG/ML IJ SOLN: 50.0000 mg | Freq: Once | INTRAMUSCULAR | Status: DC | PRN

## 2020-01-08 MED ORDER — SODIUM CHLORIDE 0.9 % IV SOLN
INTRAVENOUS | Status: DC | PRN
  Administered 2020-01-08: 250 mL via INTRAVENOUS

## 2020-01-08 MED ORDER — METHYLPREDNISOLONE SODIUM SUCC 125 MG IJ SOLR: 125.0000 mg | Freq: Once | INTRAMUSCULAR | Status: DC | PRN

## 2020-01-08 MED ORDER — ALBUTEROL SULFATE HFA 108 (90 BASE) MCG/ACT IN AERS: 2.0000 | INHALATION_SPRAY | Freq: Once | RESPIRATORY_TRACT | Status: DC | PRN

## 2020-01-08 MED ORDER — SODIUM CHLORIDE 0.9 % IV SOLN
Freq: Once | INTRAVENOUS | Status: AC
  Filled 2020-01-08: qty 10

## 2020-01-08 NOTE — Progress Notes (Signed)
  Diagnosis: COVID-19  Physician:Dr Joya Gaskins  Procedure: [facility] Med: casirivimab\imdevimab infusion - Provided patient with casirivimab\imdevimab fact sheet for patients, parents and caregivers prior to infusion.  Complications: No immediate complications noted.  Discharge: Discharged home   Dewaine Oats 01/08/2020

## 2020-01-08 NOTE — Discharge Instructions (Signed)

## 2020-05-05 IMAGING — MR MR [PERSON_NAME] LOW WO/W CM*L*
7 of 9 series · 34 of 40 positions shown · IV contrast (6ml Gadavist)
Comparison: None.

CLINICAL DATA: Heel pain. Patient stepped on a glass [REDACTED] tree
or a mint 3 months ago and is having persistent heel pain.

EXAM:
MRI OF LOWER LEFT EXTREMITY WITHOUT AND WITH CONTRAST
TECHNIQUE: Multiplanar, multisequence MR imaging of the left foot/ankle was
performed both before and after administration of intravenous
contrast.
CONTRAST:  6mL GADAVIST GADOBUTROL 1 MMOL/ML IV SOLN

[Series 3: PD fat-sat · axial · left · 3.0mm · 0.50mm/px · z∈[-61,+78]mm · 4 of 36 slices shown]
[im 1/36]
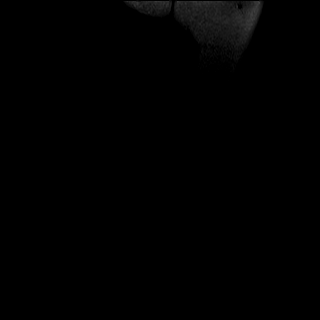
[im 12/36]
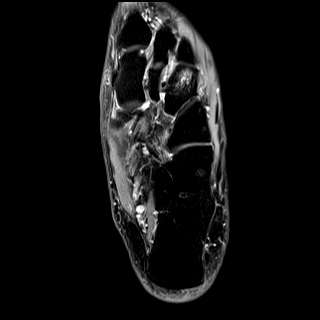
[im 24/36]
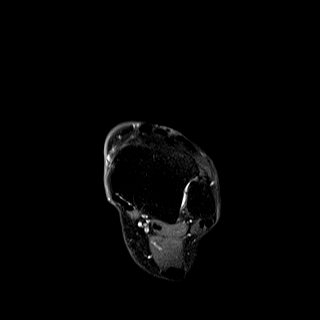
[im 36/36]
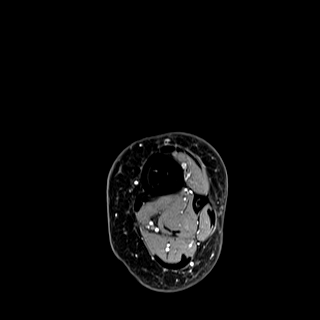

[Series 4: T2 fat-sat · axial · left · 3.0mm · 0.50mm/px · z∈[-61,+78]mm · 5 of 36 slices shown (1 of 2)]
[im 1/36]
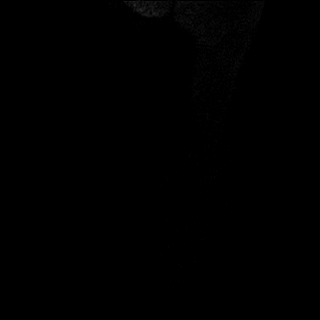
[im 9/36]
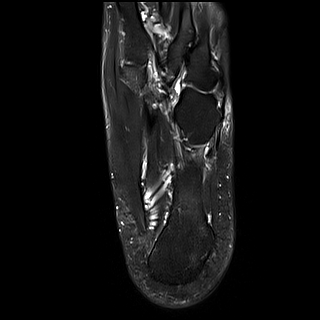
[im 18/36]
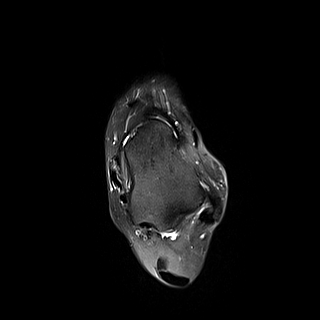
[im 27/36]
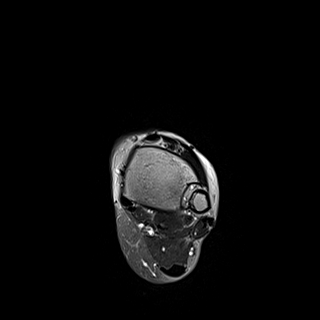
[im 36/36]
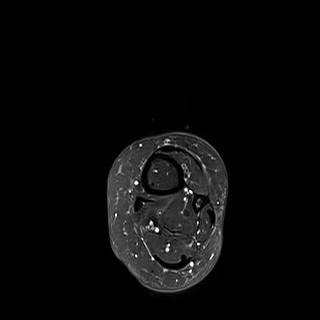

[Series 5: T1 fat-sat · axial · non-contrast · left · 3.0mm · 0.31mm/px · z∈[-61,+78]mm · 5 of 36 slices shown]
[im 1/36]
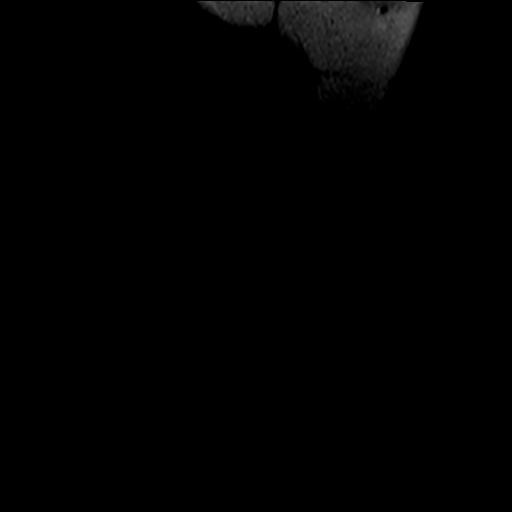
[im 9/36]
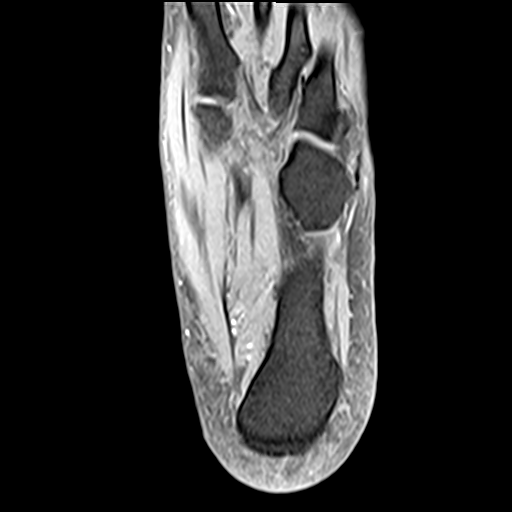
[im 18/36]
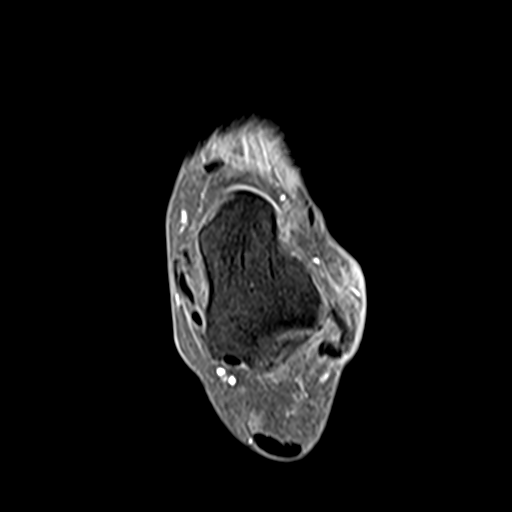
[im 27/36]
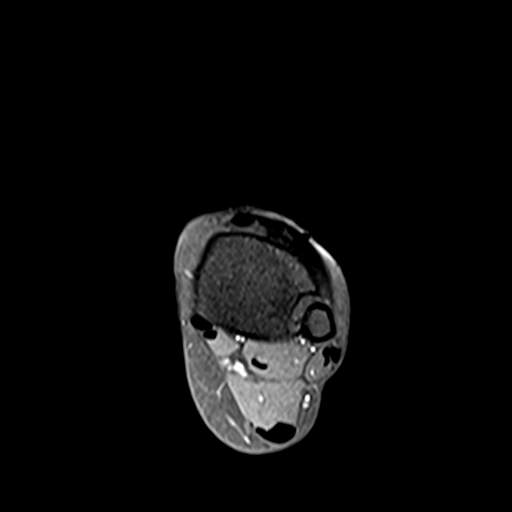
[im 36/36]
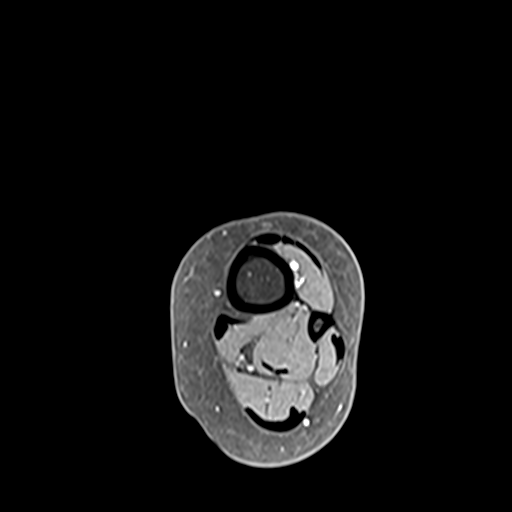

[Series 6: T2 fat-sat · coronal · left · 3.0mm · 0.62mm/px · 6 of 40 slices shown (2 of 2)]
[im 1/40]
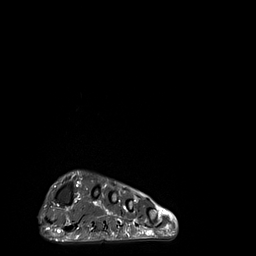
[im 8/40]
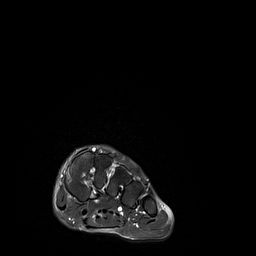
[im 16/40]
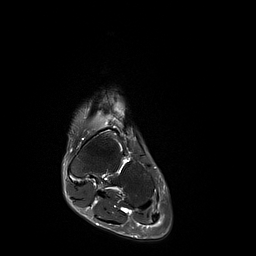
[im 24/40]
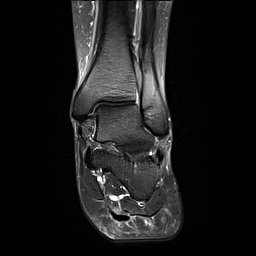
[im 32/40]
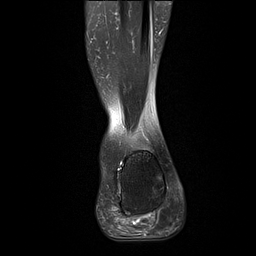
[im 40/40]
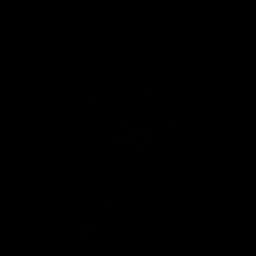

[Series 7: T1 · sagittal · left · 4.0mm · 0.70mm/px · 3 of 21 slices shown]
[im 1/21]
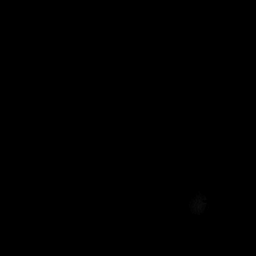
[im 11/21]
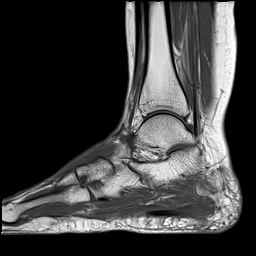
[im 21/21]
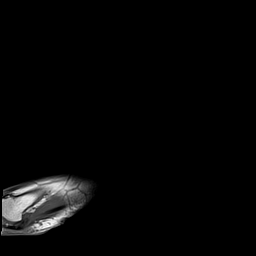

[Series 9: T1 fat-sat post-contrast · axial · left · 3.0mm · 0.31mm/px · z∈[-61,+78]mm · 5 of 36 slices shown (1 of 2)]
[im 1/36]
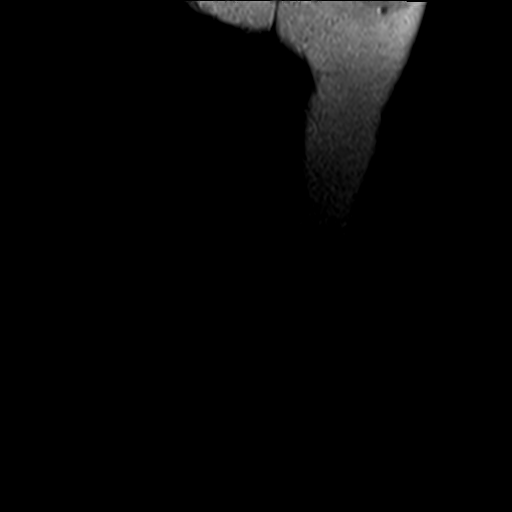
[im 9/36]
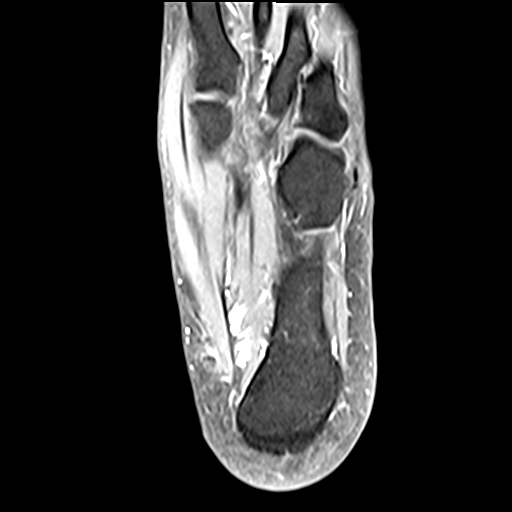
[im 18/36]
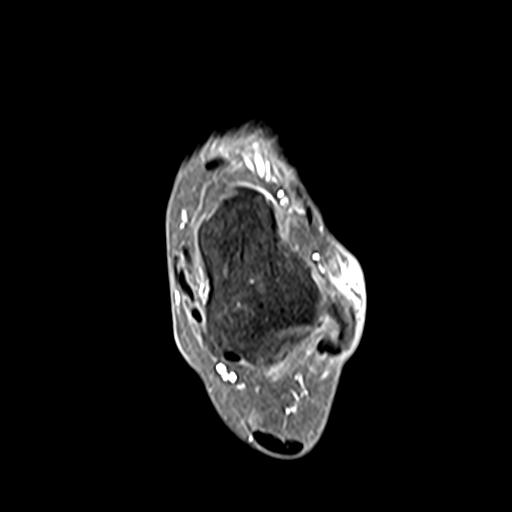
[im 27/36]
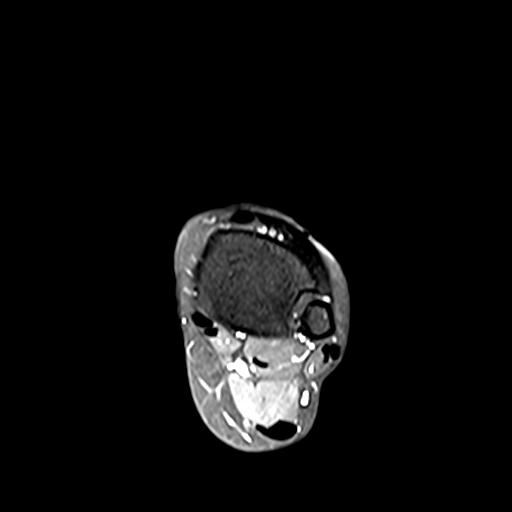
[im 36/36]
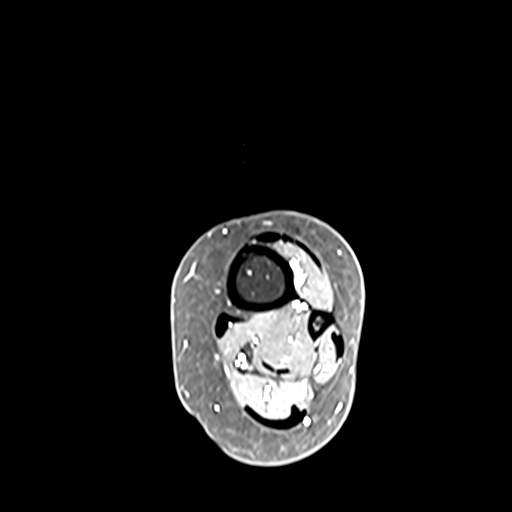

[Series 10: T1 fat-sat post-contrast · coronal · left · 3.0mm · 0.62mm/px · 6 of 40 slices shown (2 of 2)]
[im 1/40]
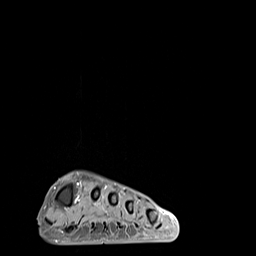
[im 8/40]
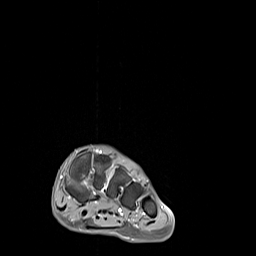
[im 16/40]
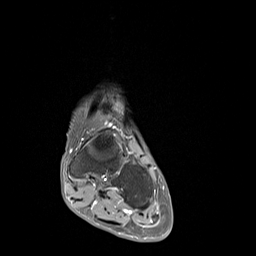
[im 24/40]
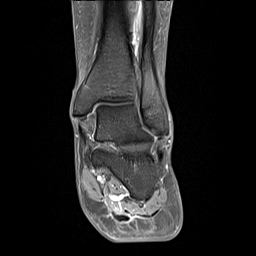
[im 32/40]
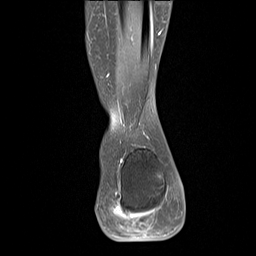
[im 40/40]
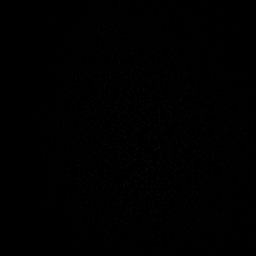

[34 of 40 positions shown; findings below may reference images not displayed]

FINDINGS: There is moderate inflammatory change in the subcutaneous fat of the
plantar heel pad. A few small bands of dark signal intensity could
represent scar tissue or fat necrosis but I do not see a definite
foreign body. There is underlying significant inflammation involving
the plantar fascia the central band is thickened and appears
edematous. There is also partial-thickness tearing along the medial
border along its deep surface. There are interstitial tears. There
is also significant enhancement around plantar fascia suggesting an
inflammatory process. No complete tear/rupture is identified.

Minimal reactive marrow edema in the calcaneus but no significant
enhancement. There is also some associated inflammatory change in
the flexor digitorum brevis muscle.

Small tibiotalar joint effusion noted but no significant
degenerative changes or cartilage defects. No osteochondral lesion.
The subtalar joints are maintained. The sinus tarsi is unremarkable.

The medial and lateral ankle ligaments are intact. The peroneal
tendons appear normal. The medial ankle tendons are intact. Mild
tenosynovitis involving the posterior tibialis tendon. The anterior
ankle tendons and Achilles tendon are normal.

No stress fracture or AVN. Early midfoot degenerative changes are
noted.
IMPRESSION: 1. Changes of plantar fasciitis with partial tearing of the central
band. There is also significant surrounding inflammation and
enhancement but I do not see a definite foreign body or abscess.
Some foreign bodies such as glass or Demchenko can be difficult to see on
MRI. High-resolution ultrasound examination sometimes can be
helpful. It is possible that these findings could be due to a
penetrating injury to the heel pad and plantar fascia.
2. Intact medial and lateral ankle ligaments and tendons.
3. No significant bony findings. Early midfoot degenerative changes.

## 2021-06-24 ENCOUNTER — Ambulatory Visit
Admission: EM | Admit: 2021-06-24 | Discharge: 2021-06-24 | Disposition: A | Discharge status: Home / Self Care | Payer: Medicare HMO | Attending: Family Medicine | Admitting: Family Medicine

## 2021-06-24 DIAGNOSIS — J029 Acute pharyngitis, unspecified: Secondary | ICD-10-CM

## 2021-06-24 DIAGNOSIS — J069 Acute upper respiratory infection, unspecified: Secondary | ICD-10-CM

## 2021-06-24 DIAGNOSIS — H6983 Other specified disorders of Eustachian tube, bilateral: Secondary | ICD-10-CM

## 2021-06-24 MED ORDER — PREDNISONE 20 MG PO TABS: 40.0000 mg | ORAL_TABLET | Freq: Every day | ORAL | 0 refills | Status: AC

## 2021-06-24 MED ORDER — BENZONATATE 100 MG PO CAPS: ORAL_CAPSULE | ORAL | 0 refills | Status: AC

## 2021-06-24 NOTE — ED Triage Notes (Signed)
Pt presents with ear ache and cough for past week

## 2021-06-24 NOTE — ED Provider Notes (Signed)
[place]   [id number] 06/24/21 Arrival Time: [id number]  ASSESSMENT & PLAN:  1. Viral URI with cough   2. Eustachian tube dysfunction, bilateral   3. Sore throat    Discussed typical duration of viral illnesses. Reports some improvement today. OTC symptom care as needed. No signs of bacterial infection.  Begin trial of: Meds ordered this encounter  Medications   benzonatate (TESSALON) 100 MG capsule    Sig: Take 1 capsule by mouth every 8 (eight) hours for cough.    Dispense:  21 capsule    Refill:  0   predniSONE (DELTASONE) 20 MG tablet    Sig: Take 2 tablets (40 mg total) by mouth daily.    Dispense:  10 tablet    Refill:  0     Follow-up Information     Ricardo Jericho, NP.   Specialty: Family Medicine Why: If worsening or failing to improve as anticipated. Contact information: [place] [address] [phone]                 Reviewed expectations re: course of current medical issues. Questions answered. Outlined signs and symptoms indicating need for more acute intervention. Understanding verbalized. After Visit Summary given.   SUBJECTIVE: History from: patient. Angela Weber is a 65 y.o. female who reports bilateral ear aching; congested and coughing; past week; abrupt onset. No SOB. Mild ST. Denies: fever, difficulty breathing, and headache. Normal PO intake without n/v/d. Very fatigued.  OBJECTIVE:  Vitals:   06/24/21 1112  BP: 126/76  Pulse: 66  Resp: 18  Temp: 97.8 F (36.6 C)  SpO2: 95%    General appearance: alert; no distress Eyes: PERRLA; EOMI; conjunctiva normal HENT: [place]; AT; with nasal congestion; both TMs with some bulging but without erythema; throat with cobblestoning Neck: supple  Lungs: speaks full sentences without difficulty; unlabored; CTAB  Extremities: no edema Skin: warm and dry Neurologic: normal gait Psychological: alert and cooperative; normal mood and affect   No Known  Allergies  Past Medical History:  Diagnosis Date   Breast cancer ([place]) 2011   Family history of adverse reaction to anesthesia    Mother, Sister - PONV   Insomnia    Social History   Socioeconomic History   Marital status: Married    Spouse name: Not on file   Number of children: Not on file   Years of education: Not on file   Highest education level: Not on file  Occupational History   Not on file  Tobacco Use   Smoking status: Never   Smokeless tobacco: Never  Vaping Use   Vaping Use: Never used  Substance and Sexual Activity   Alcohol use: Not Currently   Drug use: Not on file   Sexual activity: Not on file  Other Topics Concern   Not on file  Social History Narrative   Not on file   Social Determinants of Health   Financial Resource Strain: Not on file  Food Insecurity: Not on file  Transportation Needs: Not on file  Physical Activity: Not on file  Stress: Not on file  Social Connections: Not on file  Intimate Partner Violence: Not on file   Family History  Problem Relation Age of Onset   Healthy Mother    Healthy Father    Past Surgical History:  Procedure Laterality Date   DE QUERVAIN'S RELEASE Left 11/06/2016   FASCIECTOMY Left 10/10/2019   Procedure: PLANTAR FASCIECTOMY- INCIS.ONLY LEFT;  Surgeon: Luana Shu,  Mitzi Hansen, DPM;  Location: [place];  Service: Podiatry;  Laterality: Left;  preop sash pop block   FOREIGN BODY REMOVAL Left 10/10/2019   Procedure: REMOVAL FOREIGN BODY DEEP;  Surgeon: Caroline More, DPM;  Location: [place];  Service: Podiatry;  Laterality: Left;   MASTECTOMY Right 2011     Vanessa Kick, MD 06/24/21 1220

## 2021-09-17 ENCOUNTER — Other Ambulatory Visit: Payer: Self-pay | Admitting: Internal Medicine

## 2021-09-17 DIAGNOSIS — Z1231 Encounter for screening mammogram for malignant neoplasm of breast: Secondary | ICD-10-CM
